# Patient Record
Sex: Male | Born: 2005 | Race: Black or African American | Hispanic: No | Marital: Single | Smoking: Never smoker
Health system: Southern US, Community
[De-identification: ages and names within clinical notes are randomized; demographics above are authoritative.]

---
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
## 2024-04-02 NOTE — Progress Notes (Signed)
 PATIENT PROFILE: Albert Hull is a 18 y.o. male who presents to the [facility] for follow up.   Brief History    All prior office notes, lab results, imaging studies, and procedure reports reviewed as appropriate.  INFLAMMATORY BOWEL DISEASE HISTORY:  Type (Crohn's, UC, Indeterminate): Crohns Date of Diagnosis: 2024 Disease Extent: Gastric, ileum, colonic  Phenotype (inflammatory, fistulizing, stricturing): inflammatory Surgeries: None Complications: None Extra-intestinal Manifestations: None  Last Hospitalization associated with IBD: None  Last Flare:None  TREATMENT HISTORY: 5-ASA:   AZA/6-MP: TPMT enzyme level: 6-TG and 6-MMP (level/date): Methotrexate:  Anti-TNFs: Infliximab (Remicade) - started 11/2022 Titer/Ab (level and date) Adalimumab (Humira) Titer/Ab (level and date) Certolizumab Pegol (Cimzia) Vedolizimab (Entyvio)  Ustekinumab (Stelara) Steroids Prednisone  Budesonide  Hydrocortisone enema (Cortenema/Cortifoam)  ENDOSCOPIC HISTORY:  09/2023  PATHOLOGY:   EGD/Colonoscopy showed neutrophilic abscesses in upper GI tract, terminal ileitis without chronicity, normal right colon, and chronic colitis in left colon.   IBD HEALTH MAINTENANCE: Vaccines: tbd Skin Screen: tbd DEXA Scan: tbd   Interval History   [name] is here for f/u of crohns that involves his stomach, small bowel, and colon. He started infliximab in January and has obtained clinical remission. No abdominal pain or diarrhea. Last dose was this past Tuesday. They are planning to go the [place].    GENERAL REVIEW OF SYSTEMS:  Review of Systems  Constitutional:  Negative for chills and fever.  Respiratory:  Negative for shortness of breath.   Cardiovascular:  Negative for chest pain.  Gastrointestinal:  Negative for abdominal pain, constipation, diarrhea, melena, nausea and vomiting.  Musculoskeletal:  Negative for joint pain.  Skin:  Negative for rash.   Psychiatric/Behavioral:  Negative for substance abuse.   All other systems reviewed and are negative.    MEDICATIONS: Outpatient Encounter Medications as of 04/02/2024  Medication Sig Dispense Refill  . inFLIXimab-dyyb (INFLECTRA) 100 mg injection Inject 29.1 mLs (291 mg total) into the vein as directed Day 0, week 2, week 6, and every 8 weeks for maintenance dose.    . predniSONE (DELTASONE) 10 MG tablet Take 2 tablets (20 mg total) by mouth once daily 60 tablet 0   No facility-administered encounter medications on file as of 04/02/2024.    ALLERGIES: Patient has no known allergies.  PAST MEDICAL HISTORY: No past medical history on file.  PAST SURGICAL HISTORY: No past surgical history on file.   FAMILY HISTORY: Family History  Problem Relation Name Age of Onset  . Dementia Maternal Grandmother         Lewy Body  . Myocardial Infarction (Heart attack) Maternal Grandfather    . Colon cancer Neg Hx    . Colon polyps Neg Hx       SOCIAL HISTORY: Social History   Socioeconomic History  . Marital status: Single  Tobacco Use  . Smoking status: Never  . Smokeless tobacco: Never  Vaping Use  . Vaping status: Never Used  Substance and Sexual Activity  . Alcohol use: Never  . Drug use: Never  . Sexual activity: Defer   Social Drivers of Health   Financial Resource Strain: Low Risk  (10/02/2023)   Overall Financial Resource Strain (CARDIA)   . Difficulty of Paying Living Expenses: Not hard at all  Food Insecurity: No Food Insecurity (10/02/2023)   Hunger Vital Sign   . Worried About Programme researcher, broadcasting/film/video in the Last Year: Never true   . Ran Out of Food in the Last Year: Never true  Transportation  Needs: No Transportation Needs (10/02/2023)   PRAPARE - Transportation   . Lack of Transportation (Medical): No   . Lack of Transportation (Non-Medical): No    Vitals:   04/02/24 1504  BP: (!) 134/84  BP Location: Left upper arm  Patient Position: Sitting  BP Cuff  Size: Adult  Pulse: 87  Weight: 60.8 kg (134 lb)  Height: 176.5 cm (5' 9.5)     PHYSICAL EXAM: General: NAD, alert and oriented x 4 HEENT: PEERLA, EOMI, anciteric  Neck: supple, no JVD or thyromegaly. No lymphadenopathy.  Respiratory: No respiratory distress GI: soft, no TTP, no HSM, no rebound or guarding MSK: no edema, well perfused with 2+ pulses, Skin: Skin color, texture, turgor normal, no rashes or lesions Lymph: no LAD Neuro: No focal deficits  REVIEW OF DATA: I have reviewed the following data today: Previous GI records   ASSESSMENT AND PLAN: [name] is a 18 y.o. 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  Take both bottles at the times instructed by your provider.   Will perform EGD/Colonoscopy at end of July/beginning of August to assess for healing after starting infliximab. Handed out vaccination schedule, if PCP can't manage then I can order. No live vaccines. Labs today, return to clinic in 6 months.  I spent a total of 32 minutes in both face-to-face and non-face-to-face activities, excluding procedures performed, for this visit on the date of this encounter.    KYM Frederic Schick MD, MPH [facility] [address] [place], [place] [address] [phone]

## 2024-05-25 NOTE — Progress Notes (Signed)
 Albert Hull is a 18 y.o. male who is here for a wellness exam.  There is no problem list on file for this patient.   No past medical history on file.  History reviewed. No pertinent surgical history.   Current Outpatient Medications:  .  inFLIXimab-dyyb (INFLECTRA) 100 mg injection, Inject 29.1 mLs (291 mg total) into the vein as directed Day 0, week 2, week 6, and every 8 weeks for maintenance dose. (Patient taking differently: Inject 5 mg/kg into the vein as directed Every 2 weeks), Disp: , Rfl:  .  sodium, potassium, and magnesium (SUPREP) oral solution, Take 1 Bottle by mouth as directed One kit contains 2 bottles.  Take both bottles at the times instructed by your provider., Disp: 354 mL, Rfl: 0  No Known Allergies  Results for orders placed or performed in visit on 04/02/24  CBC w/auto Differential (5 Part)  Result Value Ref Range   WBC (White Blood Cell Count) 7.0 4.1 - 10.2 10^3/uL   RBC (Red Blood Cell Count) 5.05 4.69 - 6.13 10^6/uL   Hemoglobin 14.6 14.1 - 18.1 gm/dL   Hematocrit 57.0 59.9 - 52.0 %   MCV (Mean Corpuscular Volume) 85.0 80.0 - 100.0 fl   MCH (Mean Corpuscular Hemoglobin) 28.9 27.0 - 31.2 pg   MCHC (Mean Corpuscular Hemoglobin Concentration) 34.0 32.0 - 36.0 gm/dL   Platelet Count 739 849 - 450 10^3/uL   RDW-CV (Red Cell Distribution Width) 12.4 11.6 - 14.8 %   MPV (Mean Platelet Volume) 10.4 9.4 - 12.4 fl   Neutrophils 4.21 1.50 - 7.80 10^3/uL   Lymphocytes 2.10 1.00 - 3.60 10^3/uL   Monocytes 0.55 0.00 - 1.50 10^3/uL   Eosinophils 0.05 0.00 - 0.55 10^3/uL   Basophils 0.06 0.00 - 0.09 10^3/uL   Neutrophil % 60.3 32.0 - 70.0 %   Lymphocyte % 30.1 28.0 - 48.0 %   Monocyte % 7.9 4.0 - 13.0 %   Eosinophil % 0.7 (L) 1.0 - 5.0 %   Basophil% 0.9 0.0 - 2.0 %   Immature Granulocyte % 0.1 <=0.7 %   Immature Granulocyte Count 0.01 <=0.06 10^3/L  Comprehensive Metabolic Panel (CMP)  Result Value Ref Range   Glucose 96 70 - 110 mg/dL   Sodium 858 863 - 854 mmol/L    Potassium 4.0 3.6 - 5.1 mmol/L   Chloride 104 97 - 109 mmol/L   Carbon Dioxide (CO2) 31.7 22.0 - 32.0 mmol/L   Urea Nitrogen (BUN) 7 7 - 25 mg/dL   Creatinine 0.9 0.7 - 1.3 mg/dL   Glomerular Filtration Rate (eGFR) 126 >60 mL/min/1.73sq m   Calcium 10.3 8.7 - 10.3 mg/dL   AST  22 8 - 39 U/L   ALT  19 6 - 57 U/L   Alk Phos (alkaline Phosphatase) 66 34 - 104 U/L   Albumin 5.0 (H) 3.5 - 4.8 g/dL   Bilirubin, Total 2.5 (H) 0.3 - 1.2 mg/dL   Protein, Total 7.6 6.1 - 7.9 g/dL   A/G Ratio 1.9 1.0 - 5.0 gm/dL  Ferritin  Result Value Ref Range   Ferritin 24 23 - 336 ng/mL  Iron Panel  Result Value Ref Range   Iron 191 (H) 45 - 182 ug/dL   Total Iron Binding Capacity (TIBC) 423.2 261.0 - 478.0 ug/dL   Transferrin 697.6 796.9 - 362.0 mg/dL   % Saturation 45 %  Vitamin B12  Result Value Ref Range   Vitamin B12 325 >300 pg/mL   Narrative   <200 pg/mL:  Low, consistent with Vitamin B12 Deficiency 200-300 pg/mL: Borderline, possible Vitamin B12 Deficiency >300 pg/mL:    Normal.  Vitamin B12 Deficiency is unlikely   Vitamin D, 25-Hydroxy - [facility]  Result Value Ref Range   Vitamin D, 25-Hydroxy - [facility] 50.5 30.0 - 100.0 ng/mL   Narrative   Performed at:  [address] [place], [place]  [id number] Lab Director: Frankey Sas MD, Phone:  [phone]  Bilirubin, Total/Direct, Serum - [facility]  Result Value Ref Range   Bilirubin Total - [facility] 2.0 (H) 0.0 - 1.2 mg/dL   Bilirubin, Direct - [facility] 0.57 (H) 0.00 - 0.40 mg/dL   Bilirubin, Indirect - [facility] 1.43 (H) 0.10 - 0.80 mg/dL   Narrative   Performed at:  [address] [place], [place]  [id number] Lab Director: Frankey Sas MD, Phone:  [phone]    Social History   Socioeconomic History  . Marital status: Single  Tobacco Use  . Smoking status: Never  . Smokeless tobacco: Never  Vaping Use  . Vaping status: Never Used  Substance and Sexual Activity  . Alcohol use:  Never  . Drug use: Never  . Sexual activity: Defer   Social Drivers of Health   Financial Resource Strain: Low Risk  (05/25/2024)   Overall Financial Resource Strain (CARDIA)   . Difficulty of Paying Living Expenses: Not hard at all  Food Insecurity: No Food Insecurity (05/25/2024)   Hunger Vital Sign   . Worried About Programme researcher, broadcasting/film/video in the Last Year: Never true   . Ran Out of Food in the Last Year: Never true  Transportation Needs: No Transportation Needs (05/25/2024)   PRAPARE - Transportation   . Lack of Transportation (Medical): No   . Lack of Transportation (Non-Medical): No  Housing Stability: Low Risk  (05/25/2024)   Housing Stability Vital Sign   . Unable to Pay for Housing in the Last Year: No   . Number of Times Moved in the Last Year: 1   . Homeless in the Last Year: No    Review of Systems  Constitutional: No fever, No fatigue, No night sweats, No weakness, No significant weight change  Skin: No rash, No suspicious or changing lesions  HEENT: No change in vision, No headaches, No mouth sores, No difficulty swallowing  Cardiovascular: No chest pain, No palpitations, No lower extremity edema, No dyspnea on exertion  Respiratory: No shortness of breath, No cough, No hemoptysis  GI: No blood in stool, No significant change in bowel habits, No constipation, No diarrhea, No heartburn, No vomiting  Urologic: No difficulty urinating, No dysuria, No hematuria, No urinary urgency, No urinary frequency  Musculoskeletal: No joint pain, No joint stiffness, No joint swelling  Neurologic: No numbness or tingling, No memory loss, No seizures, No tremor  PHQ 2/9 from today's flowsheet  PHQ-2 PHQ-2 Over the last 2 weeks, how often have you been bothered by any of the following problems? Little interest or pleasure in doing things: Not at all Feeling down, depressed, or hopeless: Not at all Patient Health Questionnaire-2 Score: 0  PHQ-9 (if PHQ >=3)    Depression  Severity and Treatment Recommendations:  0-4= None  5-9= Mild / Treatment: Support, educate to call if worse; return in one month  10-14= Moderate / Treatment: Support, watchful waiting; Antidepressant or Psychotherapy  15-19= Moderately severe / Treatment: Antidepressant OR Psychotherapy  >= 20 = Major depression, severe / Antidepressant AND Psychotherapy  Goals  Goals  None     Exam  Vitals:   05/25/24 1103  BP: (!) 140/88  Pulse: 90  SpO2: 99%  Weight: 58.5 kg (129 lb)  Height: 170.2 cm (5' 7)   Body mass index is 20.2 kg/m.  General: Well-appearing, well-developed, well-nourished, alert and oriented, appears stated age, no acute distress  HEENT: Ear canals clear bilaterally, TM's clear without abnormality, neck supple with no enlarged cervical lymph nodes and no carotid bruits, thyroid non-tender without nodules. Head is normocephalic and atraumatic  Respiratory Clear to auscultation bilaterally with no wheezing/rhonchi/crackles. No increased work of breathing. Good air movement  Cardiovascular: Normal S1 and S2 with regular rhythm and normal rate with no murmur, No JVD, pedal pulses are normal, no lower extremity edema  GI: soft, nontender, nondistended, bowel sounds present, no masses or organomegaly palpated  GU: Deferred based on lack of symptoms  Neurologic: Alert and oriented x 3, cranial nerves grossly intact, no focal deficits  Musculoskeletal: No joint erythema, full range of motion without pain in upper and lower extremities  Skin: No rash or concerning appearing skin lesions  Psych: Affect appropriate, mood normal, insight normal, judgment normal  Impression/Plan  Wellness exam  I discussed the importance of a healthy lifestyle including appropriate food choices and regular exercise. I recommended a diet that is moderate in fiber with fresh fruits and vegetables and low in saturated fats and simple carbohydrates. I recommended getting exercise based  on patient's exercise capacity ideally 150 minutes per week. The patient is up to date on age appropriate screening exams and vaccines.  HTN: his blood pressure has been elevated when he has had it checked.

## 2024-06-11 ENCOUNTER — Ambulatory Visit

## 2024-06-11 DIAGNOSIS — K2950 Unspecified chronic gastritis without bleeding: Secondary | ICD-10-CM | POA: Diagnosis not present

## 2024-06-11 DIAGNOSIS — K5080 Crohn's disease of both small and large intestine without complications: Secondary | ICD-10-CM | POA: Diagnosis present

## 2024-06-11 DIAGNOSIS — K640 First degree hemorrhoids: Secondary | ICD-10-CM | POA: Diagnosis not present
